# Patient Record
Sex: Female | Born: 1965 | Race: Black or African American | Hispanic: No | Marital: Single | Smoking: Never smoker
Health system: Southern US, Community
[De-identification: ages and names within clinical notes are randomized; demographics above are authoritative.]

## PROBLEM LIST (undated history)

## (undated) DIAGNOSIS — M797 Fibromyalgia: Secondary | ICD-10-CM

## (undated) DIAGNOSIS — N80A0 Endometriosis of bladder, unspecified depth: Secondary | ICD-10-CM

## (undated) DIAGNOSIS — N808 Other endometriosis: Secondary | ICD-10-CM

## (undated) DIAGNOSIS — I10 Essential (primary) hypertension: Secondary | ICD-10-CM

## (undated) HISTORY — PX: NO PAST SURGERIES: SHX2092

---

## 2004-05-31 ENCOUNTER — Emergency Department (HOSPITAL_COMMUNITY): Admission: EM | Admit: 2004-05-31 | Discharge: 2004-06-01 | Payer: Self-pay | Admitting: Emergency Medicine

## 2004-07-29 ENCOUNTER — Emergency Department (HOSPITAL_COMMUNITY): Admission: EM | Admit: 2004-07-29 | Discharge: 2004-07-29 | Payer: Self-pay | Admitting: *Deleted

## 2004-08-24 ENCOUNTER — Emergency Department (HOSPITAL_COMMUNITY): Admission: EM | Admit: 2004-08-24 | Discharge: 2004-08-24 | Payer: Self-pay | Admitting: Emergency Medicine

## 2004-12-07 ENCOUNTER — Emergency Department (HOSPITAL_COMMUNITY): Admission: EM | Admit: 2004-12-07 | Discharge: 2004-12-07 | Payer: Self-pay | Admitting: Emergency Medicine

## 2004-12-22 ENCOUNTER — Encounter: Admission: RE | Admit: 2004-12-22 | Discharge: 2004-12-22 | Payer: Self-pay | Admitting: Neurology

## 2004-12-29 ENCOUNTER — Encounter: Admission: RE | Admit: 2004-12-29 | Discharge: 2004-12-29 | Payer: Self-pay | Admitting: Neurology

## 2005-01-03 ENCOUNTER — Ambulatory Visit (HOSPITAL_COMMUNITY): Admission: RE | Admit: 2005-01-03 | Discharge: 2005-01-03 | Payer: Self-pay | Admitting: Neurology

## 2005-01-07 ENCOUNTER — Emergency Department (HOSPITAL_COMMUNITY): Admission: EM | Admit: 2005-01-07 | Discharge: 2005-01-07 | Payer: Self-pay | Admitting: Emergency Medicine

## 2005-01-07 ENCOUNTER — Encounter: Admission: RE | Admit: 2005-01-07 | Discharge: 2005-01-07 | Payer: Self-pay | Admitting: Neurology

## 2005-01-19 ENCOUNTER — Ambulatory Visit (HOSPITAL_COMMUNITY): Admission: RE | Admit: 2005-01-19 | Discharge: 2005-01-19 | Payer: Self-pay | Admitting: Neurology

## 2005-03-10 ENCOUNTER — Emergency Department (HOSPITAL_COMMUNITY): Admission: EM | Admit: 2005-03-10 | Discharge: 2005-03-10 | Payer: Self-pay | Admitting: Emergency Medicine

## 2005-03-28 ENCOUNTER — Emergency Department (HOSPITAL_COMMUNITY): Admission: EM | Admit: 2005-03-28 | Discharge: 2005-03-28 | Payer: Self-pay | Admitting: Family Medicine

## 2005-05-02 ENCOUNTER — Encounter: Admission: RE | Admit: 2005-05-02 | Discharge: 2005-05-02 | Payer: Self-pay | Admitting: Family Medicine

## 2005-05-29 ENCOUNTER — Encounter: Admission: RE | Admit: 2005-05-29 | Discharge: 2005-05-29 | Payer: Self-pay | Admitting: Family Medicine

## 2005-06-17 ENCOUNTER — Encounter: Admission: RE | Admit: 2005-06-17 | Discharge: 2005-06-17 | Payer: Self-pay | Admitting: Rheumatology

## 2006-12-12 ENCOUNTER — Emergency Department (HOSPITAL_COMMUNITY): Admission: EM | Admit: 2006-12-12 | Discharge: 2006-12-12 | Payer: Self-pay | Admitting: Emergency Medicine

## 2007-03-06 IMAGING — CR DG FOOT COMPLETE 3+V*L*
3 series · 3 of 3 positions shown · non-contrast
Comparison: None.

CLINICAL DATA: Left toe pain.
 LEFT FOOT ? 3 VIEW:

[t foot ap left]
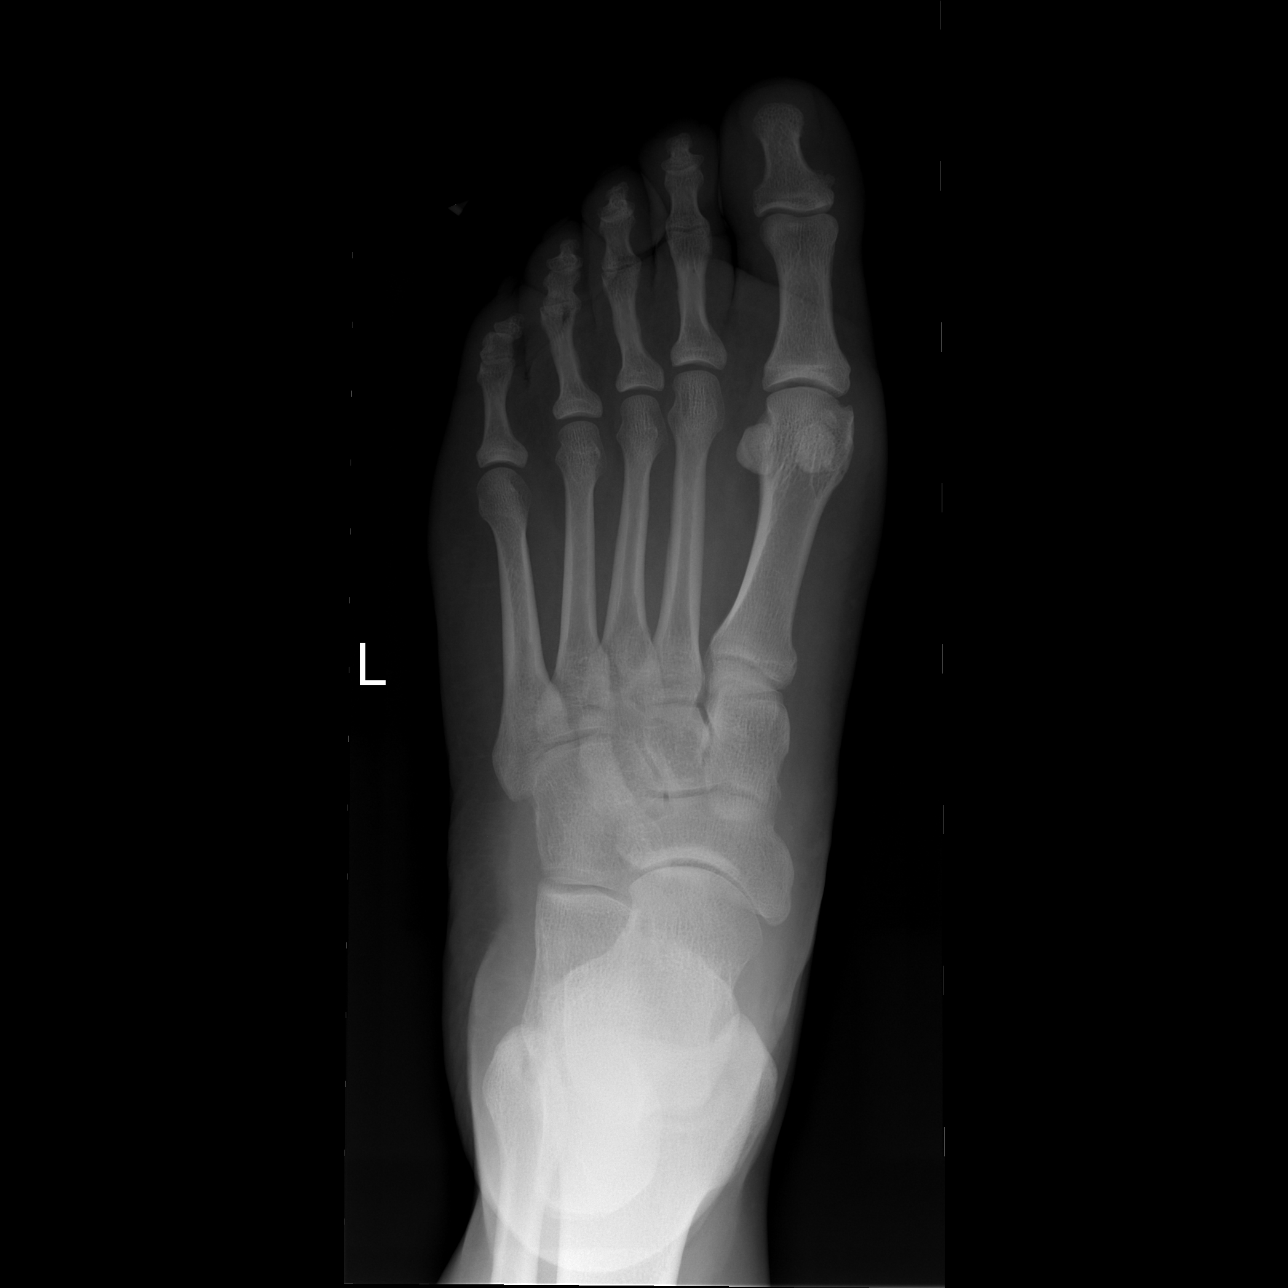

[t foot oblique left]
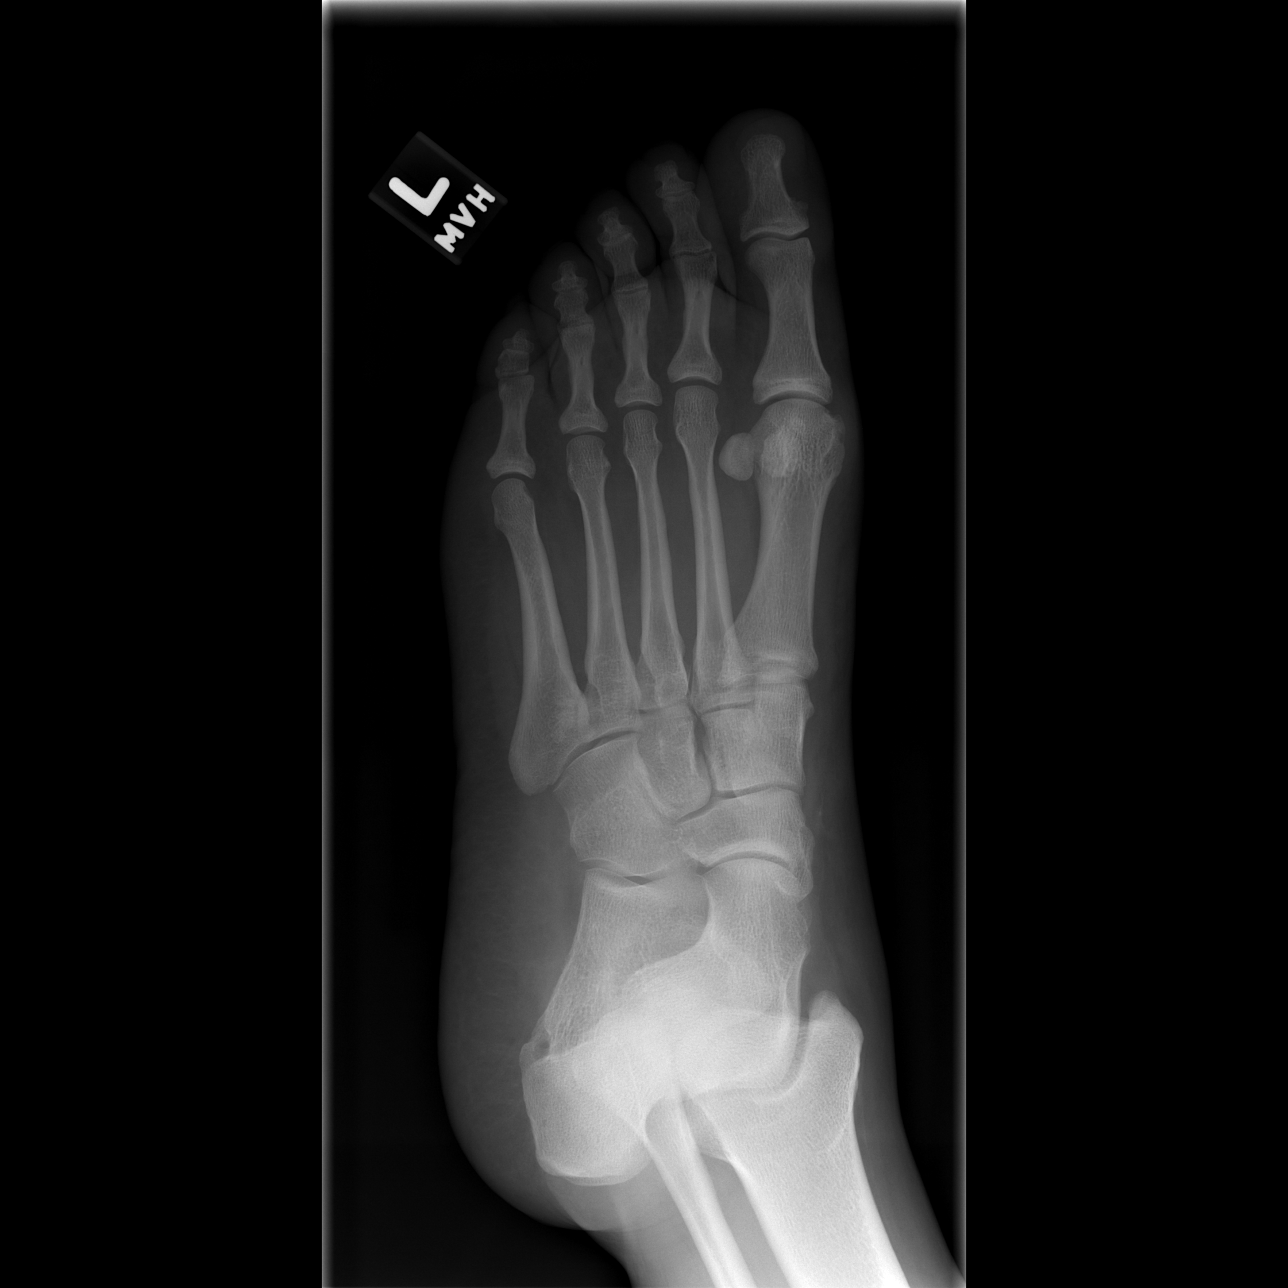

[t foot lat left]
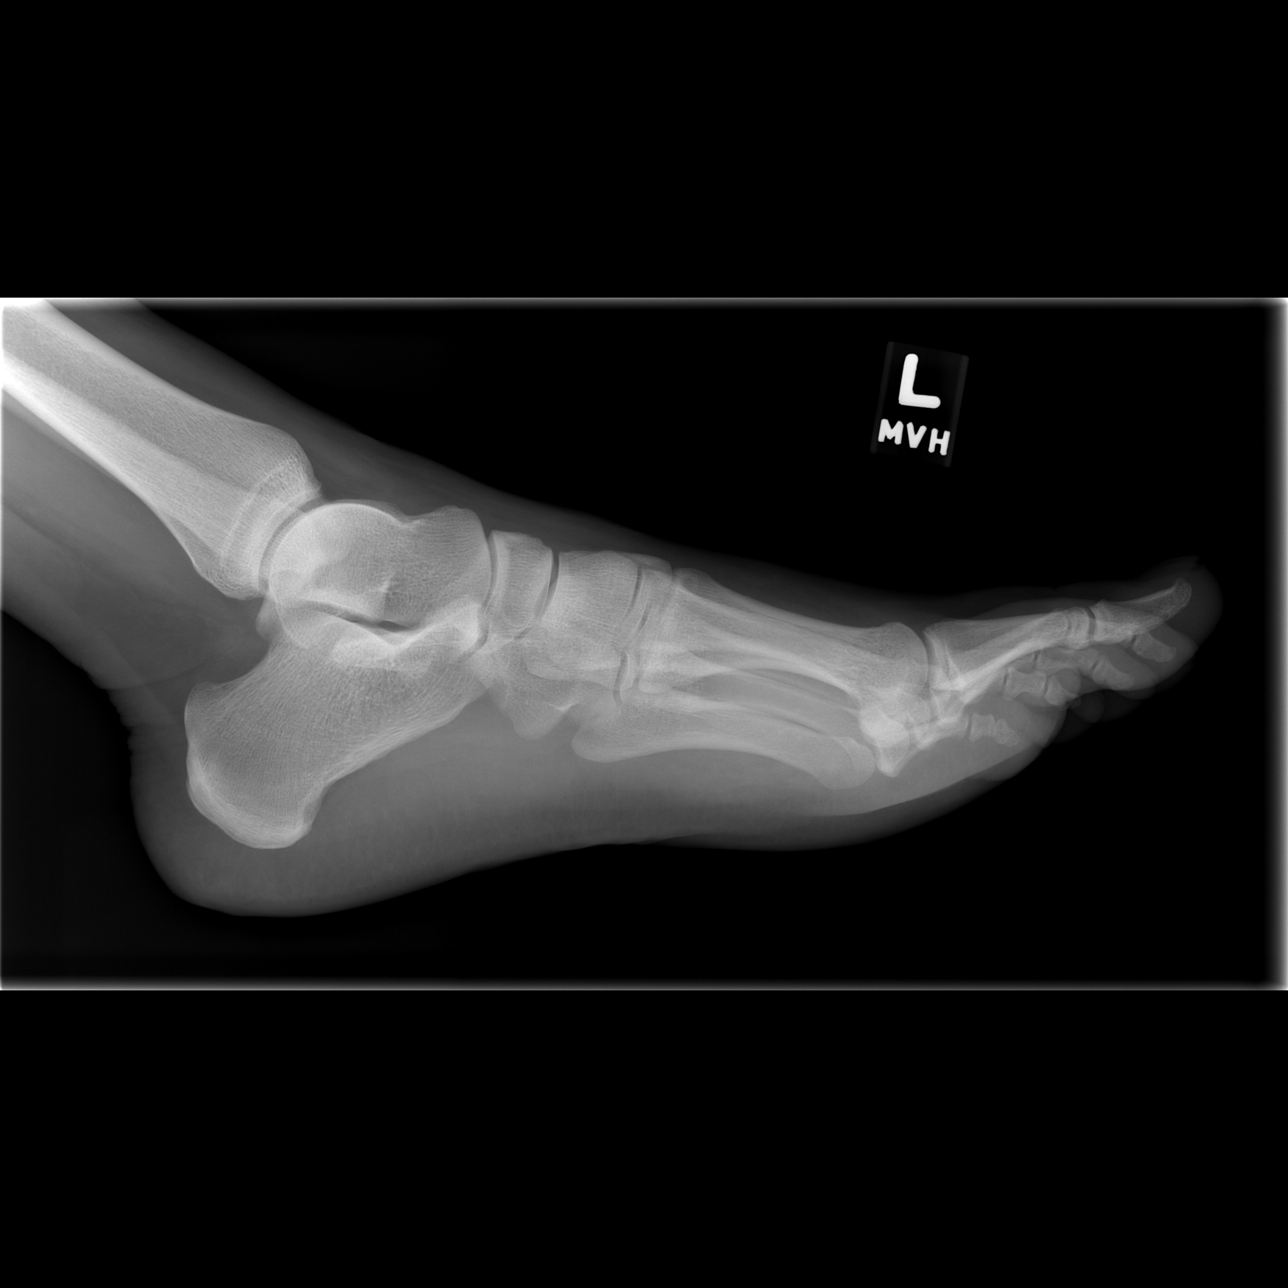

[3 of 3 positions shown; findings below may reference images not displayed]

FINDINGS: No fractures or dislocations are identified.
IMPRESSION: Normal left foot.

## 2010-04-11 ENCOUNTER — Encounter
Admission: RE | Admit: 2010-04-11 | Discharge: 2010-04-11 | Payer: Self-pay | Source: Home / Self Care | Attending: Internal Medicine | Admitting: Internal Medicine

## 2010-05-05 ENCOUNTER — Encounter: Payer: Self-pay | Admitting: Neurology

## 2011-01-24 LAB — CBC
HCT: 39
Hemoglobin: 13.3
MCHC: 34
MCV: 85.3
Platelets: 351
RBC: 4.58
RDW: 13.2
WBC: 9.2

## 2011-01-24 LAB — DIFFERENTIAL
Basophils Absolute: 0
Basophils Relative: 0
Eosinophils Absolute: 0
Eosinophils Relative: 0
Lymphocytes Relative: 6 — ABNORMAL LOW
Lymphs Abs: 0.5 — ABNORMAL LOW
Monocytes Absolute: 0.5
Monocytes Relative: 5
Neutro Abs: 8.1 — ABNORMAL HIGH
Neutrophils Relative %: 89 — ABNORMAL HIGH

## 2011-01-24 LAB — URINALYSIS, ROUTINE W REFLEX MICROSCOPIC
Bilirubin Urine: NEGATIVE
Glucose, UA: NEGATIVE
Ketones, ur: NEGATIVE
Nitrite: NEGATIVE
Protein, ur: NEGATIVE
Specific Gravity, Urine: 1.018
Urobilinogen, UA: 0.2
pH: 7

## 2011-01-24 LAB — URINE MICROSCOPIC-ADD ON

## 2011-01-24 LAB — COMPREHENSIVE METABOLIC PANEL
ALT: <8
AST: 15
Albumin: 3.4 — ABNORMAL LOW
Alkaline Phosphatase: 65
BUN: 6
CO2: 24
Calcium: 8.5
Chloride: 105
Creatinine, Ser: 0.75
GFR calc Af Amer: >60
GFR calc non Af Amer: >60
Glucose, Bld: 107 — ABNORMAL HIGH
Potassium: 4.1
Sodium: 136
Total Bilirubin: 1.2
Total Protein: 7

## 2011-01-24 LAB — PREGNANCY, URINE: Preg Test, Ur: NEGATIVE

## 2011-01-24 LAB — LIPASE, BLOOD: Lipase: 24
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
## 2017-07-01 ENCOUNTER — Encounter: Payer: Self-pay | Admitting: Emergency Medicine

## 2017-07-01 ENCOUNTER — Ambulatory Visit
Admission: EM | Admit: 2017-07-01 | Discharge: 2017-07-01 | Disposition: A | Discharge status: Home / Self Care | Payer: No Typology Code available for payment source | Attending: Family Medicine | Admitting: Family Medicine

## 2017-07-01 ENCOUNTER — Other Ambulatory Visit: Payer: Self-pay

## 2017-07-01 DIAGNOSIS — S39012A Strain of muscle, fascia and tendon of lower back, initial encounter: Secondary | ICD-10-CM | POA: Diagnosis not present

## 2017-07-01 HISTORY — DX: Endometriosis of bladder, unspecified depth: N80.A0

## 2017-07-01 HISTORY — DX: Fibromyalgia: M79.7

## 2017-07-01 HISTORY — DX: Other endometriosis: N80.8

## 2017-07-01 HISTORY — DX: Essential (primary) hypertension: I10

## 2017-07-01 MED ORDER — HYDROCODONE-ACETAMINOPHEN 5-325 MG PO TABS: ORAL_TABLET | ORAL | 0 refills | Status: DC

## 2017-07-01 MED ORDER — CYCLOBENZAPRINE HCL 10 MG PO TABS: 10.0000 mg | ORAL_TABLET | Freq: Three times a day (TID) | ORAL | 0 refills | Status: DC | PRN

## 2017-07-01 MED ORDER — PREDNISONE 20 MG PO TABS: 20.0000 mg | ORAL_TABLET | Freq: Every day | ORAL | 0 refills | Status: DC

## 2017-07-01 NOTE — ED Provider Notes (Signed)
[facility]    CSN: [id number] Arrival date & time: 07/01/17  0807     History   Chief Complaint Chief Complaint  Patient presents with  . Back Pain    HPI Gina Miller is a 52 y.o. female.   The history is provided by the patient.  Back Pain  Location:  Lumbar spine Quality:  Aching Radiates to:  Does not radiate Pain severity:  Severe Pain is:  Worse during the day Onset quality:  Sudden Duration:  5 days Timing:  Constant Progression:  Unchanged Chronicity:  New Context: falling (fell forward at home after tripping with her bed; DID NOT hit her back directly) and twisting   Context: not emotional stress, not jumping from heights, not lifting heavy objects, not MCA, not MVA, not occupational injury, not pedestrian accident, not physical stress, not recent illness and not recent injury   Relieved by:  Nothing Worsened by:  Twisting, bending and ambulation Ineffective treatments:  NSAIDs and heating pad Associated symptoms: no abdominal pain, no abdominal swelling, no bladder incontinence, no bowel incontinence, no chest pain, no dysuria, no fever, no headaches, no leg pain, no numbness, no paresthesias, no pelvic pain, no perianal numbness, no tingling, no weakness and no weight loss   Risk factors: no hx of cancer, no hx of osteoporosis, no lack of exercise, no menopause, not obese, not pregnant, no recent surgery, no steroid use and no vascular disease     Past Medical History:  Diagnosis Date  . Endometriosis of bladder   . Fibromyalgia   . Hypertension     There are no active problems to display for this patient.   History reviewed. No pertinent surgical history.  OB History    No data available       Home Medications    Prior to Admission medications   Medication Sig Start Date End Date Taking? Authorizing Provider  amLODipine (NORVASC) 2.5 MG tablet Take 2.5 mg by mouth daily.   Yes [provider]  cyclobenzaprine  (FLEXERIL) 10 MG tablet Take 1 tablet (10 mg total) by mouth 3 (three) times daily as needed for muscle spasms. 07/01/17   [name], MD  HYDROcodone-acetaminophen (NORCO/VICODIN) 5-325 MG tablet 1-2 tabs po q 8 hours prn 07/01/17   [name], MD  predniSONE (DELTASONE) 20 MG tablet Take 1 tablet (20 mg total) by mouth daily. 07/01/17   [name], MD    Family History Family History  Problem Relation Age of Onset  . Sarcoidosis Mother   . Prostate cancer Father   . Colon cancer Father     Social History Social History   Tobacco Use  . Smoking status: Never Smoker  . Smokeless tobacco: Never Used  Substance Use Topics  . Alcohol use: Yes    Frequency: Never    Comment: rarely  . Drug use: No     Allergies   Patient has no known allergies.   Review of Systems Review of Systems  Constitutional: Negative for fever and weight loss.  Cardiovascular: Negative for chest pain.  Gastrointestinal: Negative for abdominal pain and bowel incontinence.  Genitourinary: Negative for bladder incontinence, dysuria and pelvic pain.  Musculoskeletal: Positive for back pain.  Neurological: Negative for tingling, weakness, numbness, headaches and paresthesias.     Physical Exam Triage Vital Signs ED Triage Vitals  Enc Vitals Group     BP 07/01/17 0819 139/78     Pulse Rate 07/01/17 0819 88  Resp 07/01/17 0819 16     Temp 07/01/17 0819 97.8 F (36.6 C)     Temp Source 07/01/17 0819 Oral     SpO2 07/01/17 0819 100 %     Weight 07/01/17 0820 184 lb (83.5 kg)     Height 07/01/17 0820 5\' 6"  (1.676 m)     Head Circumference --      Peak Flow --      Pain Score 07/01/17 0820 9     Pain Loc --      Pain Edu? --      Excl. in GC? --    No data found.  Updated Vital Signs BP 139/78 (BP Location: Left Arm)   Pulse 88   Temp 97.8 F (36.6 C) (Oral)   Resp 16   Ht 5\' 6"  (1.676 m)   Wt 184 lb (83.5 kg)   LMP 06/15/2017 (Approximate)   SpO2 100%   BMI 29.70 kg/m    Visual Acuity Right Eye Distance:   Left Eye Distance:   Bilateral Distance:    Right Eye Near:   Left Eye Near:    Bilateral Near:     Physical Exam  Constitutional: She appears well-developed and well-nourished. No distress.  Musculoskeletal: She exhibits tenderness. She exhibits no edema.       Lumbar back: She exhibits tenderness (over the left lumbar paraspinous muscles) and spasm. She exhibits normal range of motion, no bony tenderness, no swelling, no edema, no deformity, no laceration, no pain and normal pulse.  Neurological: She is alert. She has normal reflexes. She displays normal reflexes. She exhibits normal muscle tone.  Skin: Skin is warm and dry. No rash noted. She is not diaphoretic. No erythema.  Nursing note and vitals reviewed.    UC Treatments / Results  Labs (all labs ordered are listed, but only abnormal results are displayed) Labs Reviewed - No data to display  EKG  EKG Interpretation None       Radiology No results found.  Procedures Procedures (including critical care time)  Medications Ordered in UC Medications - No data to display   Initial Impression / Assessment and Plan / UC Course  I have reviewed the triage vital signs and the nursing notes.  Pertinent labs & imaging results that were available during my care of the patient were reviewed by me and considered in my medical decision making (see chart for details).       Final Clinical Impressions(s) / UC Diagnoses   Final diagnoses:  Strain of lumbar region, initial encounter    ED Discharge Orders        Ordered    cyclobenzaprine (FLEXERIL) 10 MG tablet  3 times daily PRN     07/01/17 0856    predniSONE (DELTASONE) 20 MG tablet  Daily     07/01/17 0856    HYDROcodone-acetaminophen (NORCO/VICODIN) 5-325 MG tablet  Status:  Discontinued     07/01/17 0856    HYDROcodone-acetaminophen (NORCO/VICODIN) 5-325 MG tablet     07/01/17 0908     1. diagnosis reviewed with  patient 2. rx as per orders above; reviewed possible side effects, interactions, risks and benefits  3. Recommend supportive treatment with heat, otc analgesics prn 4. Follow-up prn if symptoms worsen or don't improve  Controlled Substance Prescriptions [place] Controlled Substance Registry consulted? Not Applicable   Payton Mccallumonty, Dellamae Rosamilia, MD 07/01/17 [phone]

## 2017-07-01 NOTE — ED Triage Notes (Signed)
Patient in today c/o back pain after sustaining a fall on 06/28/17.

## 2017-07-16 ENCOUNTER — Other Ambulatory Visit: Payer: Self-pay

## 2017-07-16 ENCOUNTER — Ambulatory Visit
Admission: EM | Admit: 2017-07-16 | Discharge: 2017-07-16 | Disposition: A | Discharge status: Home / Self Care | Payer: No Typology Code available for payment source | Attending: Emergency Medicine | Admitting: Emergency Medicine

## 2017-07-16 DIAGNOSIS — J069 Acute upper respiratory infection, unspecified: Secondary | ICD-10-CM

## 2017-07-16 DIAGNOSIS — B9789 Other viral agents as the cause of diseases classified elsewhere: Secondary | ICD-10-CM

## 2017-07-16 MED ORDER — BENZONATATE 200 MG PO CAPS: 200.0000 mg | ORAL_CAPSULE | Freq: Three times a day (TID) | ORAL | 0 refills | Status: AC | PRN

## 2017-07-16 NOTE — ED Provider Notes (Signed)
[facility] ____________________________________________  Time seen: Approximately 8:32 AM  I have reviewed the triage vital signs and the nursing notes.   HISTORY  Chief Complaint Cough   HPI Gina Miller is a 52 y.o. female reason for evaluation of 2 days of cough and chest congestion.  States started Tuesday night.  Has tried some over-the-counter cough medications without much change.  Denies accompanying nasal congestion, runny nose, sore throat, chills, body aches or fevers.  States has not felt like she has had a fever.  Has not taken any medication just prior to arrival.  States that she gets this about every year, and states that her primary normally gives her azithromycin to take care of it.  Denies other aggravating or alleviating factors.   denies other complaints. Denies chest pain, shortness of breath, abdominal pain, or rash. Denies recent sickness. Denies recent antibiotic use.     Past Medical History:  Diagnosis Date  . Endometriosis of bladder   . Fibromyalgia   . Hypertension     There are no active problems to display for this patient.   Past Surgical History:  Procedure Laterality Date  . NO PAST SURGERIES       No current facility-administered medications for this encounter.   Current Outpatient Medications:  .  amLODipine (NORVASC) 2.5 MG tablet, Take 2.5 mg by mouth daily., Disp: , Rfl:  .  benzonatate (TESSALON) 200 MG capsule, Take 1 capsule (200 mg total) by mouth 3 (three) times daily as needed for cough., Disp: 20 capsule, Rfl: 0  Allergies Patient has no known allergies.  Family History  Problem Relation Age of Onset  . Sarcoidosis Mother   . Prostate cancer Father   . Colon cancer Father     Social History Social History   Tobacco Use  . Smoking status: Never Smoker  . Smokeless tobacco: Never Used  Substance Use Topics  . Alcohol use: Not Currently    Frequency: Never  . Drug use: No    Review of  Systems Constitutional: No fever/chills ENT: No sore throat. Cardiovascular: Denies chest pain. Respiratory: Denies shortness of breath. Gastrointestinal: No abdominal pain.  No nausea, no vomiting.  No diarrhea. Genitourinary: Negative for dysuria. Skin: Negative for rash. .  ____________________________________________   PHYSICAL EXAM:  VITAL SIGNS: ED Triage Vitals  Enc Vitals Group     BP 07/16/17 0814 139/86     Pulse Rate 07/16/17 0814 (!) 114     Resp 07/16/17 0814 18     Temp 07/16/17 0814 99.4 F (37.4 C)     Temp Source 07/16/17 0814 Oral     SpO2 07/16/17 0814 96 %     Weight 07/16/17 0812 185 lb (83.9 kg)     Height 07/16/17 0812 5\' 6"  (1.676 m)     Head Circumference --      Peak Flow --      Pain Score 07/16/17 0812 0     Pain Loc --      Pain Edu? --      Excl. in GC? --     Constitutional: Alert and oriented. Well appearing and in no acute distress. Eyes: Conjunctivae are normal.  Head: Atraumatic. No sinus tenderness to palpation. No swelling. No erythema.  Ears: no erythema, normal TMs bilaterally.   Nose:No nasal congestion   Mouth/Throat: Mucous membranes are moist. No pharyngeal erythema. No tonsillar swelling or exudate.  Neck: No stridor.  No cervical spine tenderness to palpation. Hematological/Lymphatic/Immunilogical:  No cervical lymphadenopathy. Cardiovascular: Normal rate, regular rhythm. Grossly normal heart sounds.  Good peripheral circulation. Respiratory: Normal respiratory effort.  No retractions. No wheezes, rales or rhonchi. Good air movement.  Occasional dry cough noted in room.  Speaks in complete sentences. Musculoskeletal: Ambulatory with steady gait.  Neurologic:  Normal speech and language. No gait instability. Skin:  Skin appears warm, dry and intact. No rash noted. Psychiatric: Mood and affect are normal. Speech and behavior are normal.  ___________________________________________   LABS (all labs ordered are listed, but  only abnormal results are displayed)  Labs Reviewed - No data to display   PROCEDURES  INITIAL IMPRESSION / ASSESSMENT AND PLAN / ED COURSE  Pertinent labs & imaging results that were available during my care of the patient were reviewed by me and considered in my medical decision making (see chart for details).  Well-appearing patient.  No acute distress.  Suspect viral upper respiratory infection.  Patient states that she does not tolerate cough medications with codeine or hydrocodone well and declines that.  Discussed no indication for antibiotic at this time.  Will treat with as needed Tessalon.  Encourage rest, fluids, supportive care.Discussed indication, risks and benefits of medications with patient.  Discussed follow up with Primary care physician this week. Discussed follow up and return parameters including no resolution or any worsening concerns. Patient verbalized understanding and agreed to plan.   ____________________________________________   FINAL CLINICAL IMPRESSION(S) / ED DIAGNOSES  Final diagnoses:  Viral URI with cough     ED Discharge Orders        Ordered    benzonatate (TESSALON) 200 MG capsule  3 times daily PRN     07/16/17 0826       Note: This dictation was prepared with Dragon dictation along with smaller phrase technology. Any transcriptional errors that result from this process are unintentional.         Renford Dills, NP 07/16/17 [phone]

## 2017-07-16 NOTE — Discharge Instructions (Addendum)
Take medication as prescribed. Rest. Drink plenty of fluids.  ° °Follow up with your primary care physician this week as needed. Return to Urgent care for new or worsening concerns.  ° °

## 2017-07-16 NOTE — ED Triage Notes (Signed)
Patient complains of cough, congestion, productive yellow phlegm since Tuesday.
